# Patient Record
Sex: Female | Born: 1995 | Race: White | Hispanic: No | Marital: Married | Smoking: Never smoker
Health system: Southern US, Community
[De-identification: ages and names within clinical notes are randomized; demographics above are authoritative.]

## PROBLEM LIST (undated history)

## (undated) DIAGNOSIS — N941 Unspecified dyspareunia: Secondary | ICD-10-CM

## (undated) DIAGNOSIS — E559 Vitamin D deficiency, unspecified: Secondary | ICD-10-CM

## (undated) DIAGNOSIS — F419 Anxiety disorder, unspecified: Secondary | ICD-10-CM

## (undated) DIAGNOSIS — K219 Gastro-esophageal reflux disease without esophagitis: Secondary | ICD-10-CM

## (undated) DIAGNOSIS — E039 Hypothyroidism, unspecified: Secondary | ICD-10-CM

## (undated) DIAGNOSIS — I10 Essential (primary) hypertension: Secondary | ICD-10-CM

## (undated) DIAGNOSIS — G5601 Carpal tunnel syndrome, right upper limb: Secondary | ICD-10-CM

## (undated) DIAGNOSIS — J309 Allergic rhinitis, unspecified: Secondary | ICD-10-CM

## (undated) DIAGNOSIS — G43711 Chronic migraine without aura, intractable, with status migrainosus: Secondary | ICD-10-CM

## (undated) HISTORY — DX: Carpal tunnel syndrome, right upper limb: G56.01

## (undated) HISTORY — DX: Gastro-esophageal reflux disease without esophagitis: K21.9

## (undated) HISTORY — PX: TONSILLECTOMY AND ADENOIDECTOMY: SHX28

## (undated) HISTORY — DX: Vitamin D deficiency, unspecified: E55.9

## (undated) HISTORY — PX: WISDOM TOOTH EXTRACTION: SHX21

## (undated) HISTORY — DX: Hypothyroidism, unspecified: E03.9

## (undated) HISTORY — DX: Essential (primary) hypertension: I10

## (undated) HISTORY — DX: Allergic rhinitis, unspecified: J30.9

## (undated) HISTORY — DX: Chronic migraine without aura, intractable, with status migrainosus: G43.711

## (undated) HISTORY — PX: CHOLECYSTECTOMY: SHX55

## (undated) HISTORY — PX: HERNIA REPAIR: SHX51

## (undated) HISTORY — DX: Anxiety disorder, unspecified: F41.9

## (undated) HISTORY — DX: Unspecified dyspareunia: N94.10

---

## 2020-02-18 HISTORY — PX: LAPAROSCOPY: SHX197

## 2021-04-17 ENCOUNTER — Other Ambulatory Visit: Payer: Self-pay | Admitting: *Deleted

## 2021-04-17 ENCOUNTER — Encounter: Payer: Self-pay | Admitting: *Deleted

## 2021-04-18 ENCOUNTER — Ambulatory Visit (INDEPENDENT_AMBULATORY_CARE_PROVIDER_SITE_OTHER): Payer: 59 | Admitting: Psychiatry

## 2021-04-18 ENCOUNTER — Encounter: Payer: Self-pay | Admitting: Psychiatry

## 2021-04-18 VITALS — BP 138/94 | HR 87 | Ht 64.0 in | Wt 159.0 lb

## 2021-04-18 DIAGNOSIS — G43009 Migraine without aura, not intractable, without status migrainosus: Secondary | ICD-10-CM

## 2021-04-18 MED ORDER — PROPRANOLOL HCL 20 MG PO TABS: 20.0000 mg | ORAL_TABLET | Freq: Two times a day (BID) | ORAL | 3 refills | Status: DC

## 2021-04-18 MED ORDER — RIZATRIPTAN BENZOATE 5 MG PO TBDP
5.0000 mg | ORAL_TABLET | ORAL | 3 refills | Status: DC | PRN
Start: 2021-04-18 — End: 2021-08-12

## 2021-04-18 NOTE — Patient Instructions (Signed)
Start Maxalt as needed for migraines. Take at the onset of migraine. If headache recurs or does not fully resolve, you may take a second dose after 2 hours. Please avoid taking more than 2 days per week ? ?Start propranolol 20 mg twice a day for headache prevention ? ?

## 2021-04-18 NOTE — Progress Notes (Signed)
? ?Referring:  ?Dema Severin, NP ?[address] ?[place],  [place] [address] ? ?PCP: ?Dema Severin, NP ? ?Neurology was asked to evaluate Jacqueline Valenzuela, a 26 year old female for a chief complaint of headaches.  Our recommendations of care will be communicated by shared medical record.   ? ?CC:  headaches ? ?HPI:  ?Medical co-morbidities: anxiety, HTN, Hashimoto's ? ?The patient presents for evaluation of headaches which began when she was a teenager. They have worsened as she has gotten older. She has 2 headaches per week, with 1-2 debilitating headaches per month. Migraines are described as bitemporal pressure with associated photophobia, phonophobia, and nausea. They can last 3-4 days at a time. ? ?She tried Topamax and Trokendi, but could not tolerate either due to paresthesias. Tried Nurtec for rescue which was ineffective. Currently she is not taking any medication for her headaches. ? 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 ? ?Headache History: ?Onset: teenager ?Triggers: none ?Aura: no ?Location: temples ?Quality/Description: "like head is going to explode" ?Associated Symptoms: ? Photophobia: yes ? Phonophobia: yes ? Nausea: yes ?Vomiting: no ?Worse with activity?: ?Duration of headaches: 3-4 days ? ?Headache days per month: 10 ?Headache free days per month: 20 ? ?Current Treatment: ?Abortive ?None ? ?Preventative ?none ? ?Prior Therapies                                 ?Topamax - paresthesias ?Trokendi - paresthesias ?Lisinopril 20 mg daily ?Zoloft 100 mg daily ?Gabapentin 300 mg TID ?Excedrin ?Tylenol ?Ibuprofen ?Nurtec - lack of efficacy ? ?LABS: ?TSH 11/26/20 1.42 ? ?IMAGING:  ?MRI brain 03/14/21: unremarkable other than right basal ganglia DVA, unchanged from 2013 ? ?Current Outpatient Medications on File Prior to Visit  ?Medication Sig Dispense Refill  ? ALPRAZolam (XANAX) 0.5 MG tablet Take by mouth.    ? baclofen (LIORESAL) 10 MG  tablet Take 1 tablet by mouth 1-3 times per day including every night at least.    ? fluticasone (FLONASE) 50 MCG/ACT nasal spray TAKE 1 SPRAY INTO EACH NOSTRIL DAILY FOR 30 DAYS    ? gabapentin (NEURONTIN) 300 MG capsule Take 300 mg by mouth 3 (three) times daily.    ? hydrOXYzine (ATARAX) 25 MG tablet Take by mouth at bedtime.    ? levothyroxine (SYNTHROID) 100 MCG tablet Take 1 tablet by mouth daily.    ? lisinopril (ZESTRIL) 10 MG tablet Take 20 mg by mouth daily.    ? norethindrone (MICRONOR) 0.35 MG tablet Take 1 tablet by mouth daily.    ? omeprazole (PRILOSEC) 40 MG capsule TAKE 1 CAPSULE BY MOUTH ONCE DAILY IN THE EVENING FOR 90 DAYS    ? ?No current facility-administered medications on file prior to visit.  ? ? ? ?Allergies: ?Allergies  ?Allergen Reactions  ? Promethazine Other (See Comments)  ?  hallucinate ?  ? Latex Itching, Rash and Swelling  ?  unknown ?  ? ? ?Family History: ?Migraine or other headaches in the family:  mom ?Aneurysms in a first degree relative:  no ?Brain tumors in the family:  no ?Other neurological illness in the family:   no ? ?Past Medical History: ?Past Medical History:  ?Diagnosis Date  ? Allergic rhinitis   ? Anxiety disorder   ? Carpal tunnel syndrome on right   ? Chronic migraine without aura,  intractable, with status migrainosus   ? Dyspareunia in female   ? Gastroesophageal reflux disease   ? Hypertension, essential   ? Hypothyroidism, adult   ? Vitamin D deficiency, unspecified   ? ? ?Past Surgical History ?Past Surgical History:  ?Procedure Laterality Date  ? CHOLECYSTECTOMY    ? HERNIA REPAIR    ? LAPAROSCOPY  2022  ? endometriosis  ? TONSILLECTOMY AND ADENOIDECTOMY    ? WISDOM TOOTH EXTRACTION    ? ? ?Social History: ?Social History  ? ?Tobacco Use  ? Smoking status: Never  ? Smokeless tobacco: Never  ?Substance Use Topics  ? Alcohol use: Never  ? Drug use: Never  ? ? ?ROS: ?Negative for fevers, chills. Positive for headaches. All other systems reviewed and negative  unless stated otherwise in HPI. ? ? ?Physical Exam:  ? ?Vital Signs: ?BP (!) 138/94   Pulse 87   Ht 5\' 4"  (1.626 m)   Wt 159 lb (72.1 kg)   BMI 27.29 kg/m?  ?GENERAL: well appearing,in no acute distress,alert ?SKIN:  Color, texture, turgor normal. No rashes or lesions ?HEAD:  Normocephalic/atraumatic. ?CV:  RRR ?RESP: Normal respiratory effort ?MSK: +tenderness to palpation over bilateral temples ? ?NEUROLOGICAL: ?Mental Status: Alert, oriented to person, place and time,Follows commands ?Cranial Nerves: PERRL, visual fields intact to confrontation, extraocular movements intact, facial sensation intact, no facial droop or ptosis, hearing grossly intact, no dysarthria ?Motor: muscle strength 5/5 both upper and lower extremities,no drift, normal tone ?Reflexes: 2+ throughout ?Sensation: intact to light touch all 4 extremities ?Coordination: Finger-to- nose-finger intact bilaterally ?Gait: normal-based ? ? ?IMPRESSION: ?26 year old female with a history of anxiety, HTN, Hashimoto's who presents for evaluation of migraines. She was unable to tolerate Topamax/Trokendi due to paresthesias. Will start propranolol for migraine prevention. Maxalt started for rescue. ? ?PLAN: ?-Prevention: Start propranolol 20 mg BID ?-Rescue: Start Maxalt 5 mg PRN ?-next steps: consider SNRI, CGRP, Botox ? ?I spent a total of 24 minutes chart reviewing and counseling the patient. Headache education was done. Discussed treatment options including preventive and acute medications. Discussed medication overuse headache and to limit use of acute treatments to no more than 2 days/week or 10 days/month. Discussed medication side effects, adverse reactions and drug interactions. Written educational materials and patient instructions outlining all of the above were given. ? ?Follow-up: 3-4 months ? ? ?34, MD ?04/18/2021   ?3:23 PM ? ? ?

## 2021-04-24 ENCOUNTER — Encounter: Payer: Self-pay | Admitting: Psychiatry

## 2021-04-29 ENCOUNTER — Encounter: Payer: Self-pay | Admitting: Psychiatry

## 2021-05-07 ENCOUNTER — Telehealth: Payer: Self-pay | Admitting: *Deleted

## 2021-05-07 ENCOUNTER — Other Ambulatory Visit: Payer: Self-pay | Admitting: Psychiatry

## 2021-05-07 MED ORDER — EMGALITY 120 MG/ML ~~LOC~~ SOAJ: 1.0000 "pen " | SUBCUTANEOUS | 3 refills | Status: DC

## 2021-05-07 MED ORDER — EMGALITY 120 MG/ML ~~LOC~~ SOAJ: 2.0000 "pen " | Freq: Once | SUBCUTANEOUS | 0 refills | Status: AC

## 2021-05-07 NOTE — Telephone Encounter (Signed)
Emgality PA, key [id number]. Your PA has been faxed to the plan as a paper copy. Please contact the plan directly if you haven't received a determination in a typical timeframe. ?You will be notified of the determination electronically and via fax. ?

## 2021-05-08 NOTE — Telephone Encounter (Signed)
Received call from pharmacist asking for clarification of emgality Rx sent yesterday. I informed her one is for loading dose and other is maintenance dose. I advised pA is in process. She will hold maintenance Rx and fill loading Rx when PA is finalized. She verbalized understanding, appreciation. ? ?

## 2021-05-08 NOTE — Telephone Encounter (Addendum)
Received fax from insurance asking for member's full name, ID and DOB. The patient is insured under spouse's name. I messaged her asking for his DOB in order to [employer] PA. Patient replied: [employer] is under her name. I faxed copy of her card to [employer].  ?

## 2021-05-13 NOTE — Telephone Encounter (Addendum)
Received fax from [employer] re: unable to process emgality PA. She has alternative pharmacy benefits. Pharmacy must bill primary insurance plan first. If med is not covered or was denied an explanation of benefits or proof of denial is required prior to coverage with [employer]. Called pharmacy, spoke with Inetta Fermo who stated patient's primary insurance is [facility], under husband's name. When she runs Rx it states other meds must be tried first. She advised a PA needs to be done with [facility] phone #[phone]. Called #, reached [facility] help desk, spoke with Elvera Lennox, answered clinical questions. Routed to pharmacy for clinical review, reply via fax, phone in 72 hours. PA- [id number].  ?Sent patient my chart to update her. ?

## 2021-05-13 NOTE — Telephone Encounter (Signed)
Called [employer] to check status of emgality PA, spoke with Olegario Messier who stated they don't have emgality PA on file for patient. Did not get it from [facility]. She will fax form. Fax back as urgent, 2nd attempt, and they will try to make decision in 24 hours. Fax received,  placed on MD desk for review, signature. ?

## 2021-05-13 NOTE — Telephone Encounter (Signed)
PA form signed, faxed to [employer]. Received confirmation. ?

## 2021-05-14 NOTE — Telephone Encounter (Signed)
She has failed Topamax (paresthesias) and a beta blocker (propranolol - itching, paresthesias), so she should qualify for Emgality. We should be able to appeal

## 2021-05-14 NOTE — Telephone Encounter (Signed)
[facility] denied Emgality: must have 2 month try/fail or cannot take two of following: ?amitriptyline, beta blocker, cadesartan, divalproex, botox, topiramate, venlafaxine.   ?She has only failed Topamax.  ?Sent to MD. ?

## 2021-05-14 NOTE — Telephone Encounter (Signed)
Patient has failed topamax and propranolol, will appeal emgality denial. Letter written and on MD desk for review , signature.  ?

## 2021-05-15 NOTE — Telephone Encounter (Signed)
Emgality appeal letter signed faxed to [phone], [facility] Appeals. Received confirmation. ?

## 2021-05-20 NOTE — Telephone Encounter (Signed)
Called [facility] pharmacy help desk, spoke with Marcelino Duster who stated no status of appeal as of yet, appeals dept  # [phone].  ?

## 2021-05-23 ENCOUNTER — Encounter: Payer: Self-pay | Admitting: *Deleted

## 2021-05-23 NOTE — Telephone Encounter (Signed)
Called [employer], spoke with Jacqueline Valenzuela who stated PA overturned, emgality is approved until 11/20/2021. ?Sent my chart to patient. ?

## 2021-06-10 ENCOUNTER — Other Ambulatory Visit: Payer: Self-pay

## 2021-06-10 ENCOUNTER — Emergency Department (HOSPITAL_COMMUNITY): Payer: 59

## 2021-06-10 ENCOUNTER — Emergency Department (HOSPITAL_COMMUNITY)
Admission: EM | Admit: 2021-06-10 | Discharge: 2021-06-11 | Discharge status: Against Medical Advice | Payer: 59 | Attending: Emergency Medicine | Admitting: Emergency Medicine

## 2021-06-10 ENCOUNTER — Encounter (HOSPITAL_COMMUNITY): Payer: Self-pay

## 2021-06-10 DIAGNOSIS — Z793 Long term (current) use of hormonal contraceptives: Secondary | ICD-10-CM | POA: Diagnosis not present

## 2021-06-10 DIAGNOSIS — Z5321 Procedure and treatment not carried out due to patient leaving prior to being seen by health care provider: Secondary | ICD-10-CM | POA: Diagnosis not present

## 2021-06-10 DIAGNOSIS — R0789 Other chest pain: Secondary | ICD-10-CM | POA: Diagnosis present

## 2021-06-10 DIAGNOSIS — R000 Tachycardia, unspecified: Secondary | ICD-10-CM | POA: Insufficient documentation

## 2021-06-10 LAB — CBC WITH DIFFERENTIAL/PLATELET
Abs Immature Granulocytes: 0.02 10*3/uL (ref 0.00–0.07)
Basophils Absolute: 0 10*3/uL (ref 0.0–0.1)
Basophils Relative: 0 %
Eosinophils Absolute: 0 10*3/uL (ref 0.0–0.5)
Eosinophils Relative: 1 %
HCT: 39.8 % (ref 36.0–46.0)
Hemoglobin: 13 g/dL (ref 12.0–15.0)
Immature Granulocytes: 0 %
Lymphocytes Relative: 32 %
Lymphs Abs: 1.8 10*3/uL (ref 0.7–4.0)
MCH: 28.5 pg (ref 26.0–34.0)
MCHC: 32.7 g/dL (ref 30.0–36.0)
MCV: 87.3 fL (ref 80.0–100.0)
Monocytes Absolute: 0.5 10*3/uL (ref 0.1–1.0)
Monocytes Relative: 8 %
Neutro Abs: 3.4 10*3/uL (ref 1.7–7.7)
Neutrophils Relative %: 59 %
Platelets: 266 10*3/uL (ref 150–400)
RBC: 4.56 MIL/uL (ref 3.87–5.11)
RDW: 12.1 % (ref 11.5–15.5)
WBC: 5.8 10*3/uL (ref 4.0–10.5)
nRBC: 0 % (ref 0.0–0.2)

## 2021-06-10 LAB — BASIC METABOLIC PANEL
Anion gap: 6 (ref 5–15)
BUN: 10 mg/dL (ref 6–20)
CO2: 27 mmol/L (ref 22–32)
Calcium: 9.9 mg/dL (ref 8.9–10.3)
Chloride: 105 mmol/L (ref 98–111)
Creatinine, Ser: 0.73 mg/dL (ref 0.44–1.00)
GFR, Estimated: >60 mL/min (ref >60)
Glucose, Bld: 95 mg/dL (ref 70–99)
Potassium: 3.7 mmol/L (ref 3.5–5.1)
Sodium: 138 mmol/L (ref 135–145)

## 2021-06-10 LAB — TROPONIN I (HIGH SENSITIVITY): Troponin I (High Sensitivity): <2 ng/L (ref <18)

## 2021-06-10 LAB — I-STAT BETA HCG BLOOD, ED (MC, WL, AP ONLY): I-stat hCG, quantitative: <5 m[IU]/mL (ref <5)

## 2021-06-10 LAB — D-DIMER, QUANTITATIVE: D-Dimer, Quant: <0.27 ug/mL-FEU (ref 0.00–0.50)

## 2021-06-10 NOTE — ED Notes (Signed)
Pt states they are leaving d/t wait time ?

## 2021-06-10 NOTE — ED Triage Notes (Signed)
Pt began having 7/10 central to left sharp chest pain while cooking. Radiates to upper back. At this time chest pain is dull and still in upper back. No cardiac history. Denies sob ?

## 2021-06-10 NOTE — ED Provider Triage Note (Signed)
Emergency Medicine Provider Triage Evaluation Note ? ?Jacqueline Valenzuela , a 26 y.o. female  was evaluated in triage.  Pt complains of chest pain.  Chest pain started at 5 PM while patient was cooking.  Pain has been constant since then.  Pain is located to left side of her chest and radiates to her left arm.  Pain has waxed and waned in intensity.  Patient describes pain as a discomfort to "sharp pain."   ? ?Patient is on OCP birth control. ? ?Patient denies any nausea, vomiting, diaphoresis, palpitations, lightheadedness, syncope, hemoptysis, leg swelling or tenderness, history of DVT or PE. ? ?Review of Systems  ?Positive: Chest pain ?Negative: See above ? ?Physical Exam  ?BP (!) 129/98   Pulse (!) 101   Temp 98.7 ?F (37.1 ?C) (Oral)   Resp 16   SpO2 100%  ?Gen:   Awake, no distress   ?Resp:  Normal effort, clear to auscultation bilaterally ?MSK:   Moves extremities without difficulty; no swelling or tenderness of bilateral lower extremities ?Other:  +2 radial pulse bilaterally. ? ?Medical Decision Making  ?Medically screening exam initiated at 7:29 PM.  Appropriate orders placed.  Keiva Mcdougald was informed that the remainder of the evaluation will be completed by another provider, this initial triage assessment does not replace that evaluation, and the importance of remaining in the ED until their evaluation is complete. ? ?ACS work-up initiated.  Additionally will add on D-dimer due to patient's OCP use and heart rate of 101 ?  ?Haskel Schroeder, PA-C ?06/10/21 1931 ? ?

## 2021-06-11 LAB — TROPONIN I (HIGH SENSITIVITY): Troponin I (High Sensitivity): <2 ng/L (ref <18)

## 2021-08-12 ENCOUNTER — Encounter: Payer: Self-pay | Admitting: Psychiatry

## 2021-08-12 ENCOUNTER — Ambulatory Visit (INDEPENDENT_AMBULATORY_CARE_PROVIDER_SITE_OTHER): Payer: 59 | Admitting: Psychiatry

## 2021-08-12 VITALS — BP 123/88 | HR 118 | Ht 64.0 in | Wt 153.1 lb

## 2021-08-12 DIAGNOSIS — G43019 Migraine without aura, intractable, without status migrainosus: Secondary | ICD-10-CM

## 2021-08-12 MED ORDER — RIZATRIPTAN BENZOATE 10 MG PO TABS: 10.0000 mg | ORAL_TABLET | ORAL | 6 refills | Status: DC | PRN

## 2021-08-12 MED ORDER — EMGALITY 120 MG/ML ~~LOC~~ SOAJ: 1.0000 "pen " | SUBCUTANEOUS | 6 refills | Status: DC

## 2021-08-12 NOTE — Progress Notes (Signed)
   CC:  headaches  Follow-up Visit  Last visit: 04/18/21  Brief HPI: 26 year old female with a history of anxiety, HTN, Hashimoto's disease who follows in clinic for migraines.  At her last visit she was started on propranolol for migraine prevention and Maxalt for rescue.  Interval History: Propranolol caused side effects (face tingling) so she stopped it. She was switched to [facility] in March. This did improve her migraines and she has had periods where she would go a couple of weeks without a migraine. It does burn when she injects it but she otherwise has no side effects. Maxalt 5 mg PRN helps but headache will typically return a few hours later. She has not taken a second dose because she only gets 4 pills per month and is afraid she will run out. It does not cause side effects but she does not like the taste of the dissolvable tablets.   Headache days per month: 8 Headache free days per month: 22   Current Headache Regimen: Preventative: Emgality 120 mg monthly Abortive: Maxalt 5 mg PRN  Prior Therapies                                  Topamax - paresthesias Trokendi - paresthesias Lisinopril 20 mg daily Propranolol 20 mg BID Zoloft 100 mg daily Gabapentin 300 mg TID Emgality 120 mg monthly Excedrin Tylenol Ibuprofen Nurtec - lack of efficacy Maxalt 5 mg PRN  Physical Exam:   Vital Signs: BP 123/88   Pulse (!) 118   Ht 5\' 4"  (1.626 m)   Wt 153 lb 1 oz (69.4 kg)   BMI 26.27 kg/m  GENERAL:  well appearing, in no acute distress, alert  SKIN:  Color, texture, turgor normal. No rashes or lesions HEAD:  Normocephalic/atraumatic. RESP: normal respiratory effort MSK:  No gross joint deformities.   NEUROLOGICAL: Mental Status: Alert, oriented to person, place and time, Follows commands, and Speech fluent and appropriate. Cranial Nerves: PERRL, face symmetric, no dysarthria, hearing grossly intact Motor: moves all extremities equally Gait:  normal-based.  IMPRESSION: 26 year old female with a history of anxiety, HTN, Hashimoto's disease who presents for follow up of migraines. She has had improvement with Emgality but continues to have breakthrough migraines each month. Maxalt helps but does not fully relieve her headaches and she only gets 4 pills per month. Will increase Maxalt to 10 mg PRN which may be more effective.   PLAN: -Prevention: Continue Emgality 120 mg monthly -Rescue: Increase Maxalt to 10 mg PRN -Next steps: consider Qulipta, Cymbalta for prevention   Follow-up: 6 months  I spent a total of 20 minutes on the date of the service. Headache education was done. Discussed treatment options including preventive and acute medications. Discussed medication side effects, adverse reactions and drug interactions. Written educational materials and patient instructions outlining all of the above were given.  Ocie Doyne, MD 08/12/21 2:41 PM

## 2021-09-10 ENCOUNTER — Encounter: Payer: Self-pay | Admitting: Psychiatry

## 2021-09-11 ENCOUNTER — Other Ambulatory Visit: Payer: Self-pay | Admitting: Psychiatry

## 2021-09-11 MED ORDER — METHYLPREDNISOLONE 4 MG PO TBPK: ORAL_TABLET | ORAL | 0 refills | Status: DC

## 2021-12-13 ENCOUNTER — Encounter: Payer: Self-pay | Admitting: *Deleted

## 2021-12-13 ENCOUNTER — Telehealth: Payer: Self-pay | Admitting: *Deleted

## 2021-12-13 NOTE — Telephone Encounter (Signed)
EMGALITY INJ 120MG /ML, use as directed (1 per month), is approved for 12 months through 10/27/ 2024 .

## 2021-12-13 NOTE — Telephone Encounter (Signed)
Emgality PA< Key: [id number]. Your information has been sent to [facility].

## 2021-12-26 ENCOUNTER — Encounter (INDEPENDENT_AMBULATORY_CARE_PROVIDER_SITE_OTHER): Payer: 59 | Admitting: Psychiatry

## 2021-12-26 DIAGNOSIS — G43009 Migraine without aura, not intractable, without status migrainosus: Secondary | ICD-10-CM

## 2021-12-27 MED ORDER — ONDANSETRON HCL 8 MG PO TABS: 8.0000 mg | ORAL_TABLET | Freq: Three times a day (TID) | ORAL | 6 refills | Status: AC | PRN

## 2021-12-27 MED ORDER — SUMATRIPTAN SUCCINATE 50 MG PO TABS: 50.0000 mg | ORAL_TABLET | ORAL | 6 refills | Status: DC | PRN

## 2021-12-27 NOTE — Telephone Encounter (Signed)
Please see the MyChart message reply(ies) for my assessment and plan.    This patient gave consent for this Medical Advice Message and is aware that it may result in a bill to [employer], as well as the possibility of receiving a bill for a co-payment or deductible. They are an established patient, but are not seeking medical advice exclusively about a problem treated during an in person or video visit in the last seven days. I did not recommend an in person or video visit within seven days of my reply.    I spent a total of 5 minutes cumulative time within 7 days through [employer].  Ocie Doyne, MD  12/27/21 8:44 AM

## 2022-02-27 ENCOUNTER — Ambulatory Visit: Payer: 59 | Admitting: Psychiatry

## 2022-05-07 ENCOUNTER — Other Ambulatory Visit: Payer: Self-pay | Admitting: Psychiatry

## 2022-05-08 NOTE — Telephone Encounter (Signed)
Last seen on 08/13/22 Follow up scheduled on 09/29/22

## 2022-07-15 ENCOUNTER — Encounter: Payer: Self-pay | Admitting: Psychiatry

## 2022-07-15 ENCOUNTER — Other Ambulatory Visit: Payer: Self-pay | Admitting: Neurology

## 2022-07-15 MED ORDER — METHYLPREDNISOLONE 4 MG PO TBPK: ORAL_TABLET | ORAL | 0 refills | Status: AC

## 2022-07-15 NOTE — Telephone Encounter (Signed)
We can give the patient a steroid taper. Please ask her if she is willing to start it. It's a short course, 6 days.

## 2022-07-15 NOTE — Telephone Encounter (Signed)
Medrol dose pack sent to the pharmacy

## 2022-07-15 NOTE — Telephone Encounter (Signed)
Dr.Camara you are work in pm provider. Pt reports she has been taking her Maxalt to 10 mg PRN and Emgality monthly.  Please advise

## 2022-09-15 ENCOUNTER — Other Ambulatory Visit: Payer: Self-pay | Admitting: Psychiatry

## 2022-09-15 NOTE — Telephone Encounter (Signed)
Last seen on 08/12/21 Follow up scheduled on 09/29/22

## 2022-09-29 ENCOUNTER — Ambulatory Visit: Payer: 59 | Admitting: Psychiatry

## 2022-10-18 ENCOUNTER — Other Ambulatory Visit: Payer: Self-pay | Admitting: Psychiatry

## 2022-10-21 NOTE — Telephone Encounter (Signed)
Last seen on 03/14/21 No follow up scheduled   Pt needs OV scheduled

## 2022-10-22 ENCOUNTER — Other Ambulatory Visit: Payer: Self-pay | Admitting: Psychiatry

## 2022-10-27 ENCOUNTER — Other Ambulatory Visit: Payer: Self-pay | Admitting: Psychiatry

## 2022-10-28 NOTE — Telephone Encounter (Signed)
Last seen on 08/12/21 No 6 month follow up scheduled

## 2022-10-31 ENCOUNTER — Other Ambulatory Visit: Payer: Self-pay | Admitting: Psychiatry

## 2022-11-03 ENCOUNTER — Other Ambulatory Visit: Payer: Self-pay | Admitting: Psychiatry

## 2022-11-04 MED ORDER — EMGALITY 120 MG/ML ~~LOC~~ SOAJ: 120.0000 mg | SUBCUTANEOUS | 0 refills | Status: DC

## 2022-11-04 NOTE — Telephone Encounter (Signed)
Duplicate Rx was approved today on 11/04/22 already.

## 2022-11-04 NOTE — Telephone Encounter (Signed)
Last seen on 08/12/21 No follow up scheduled

## 2022-12-03 ENCOUNTER — Telehealth: Payer: Self-pay

## 2022-12-03 NOTE — Telephone Encounter (Signed)
*  [facility]  Pharmacy Patient Advocate Encounter   Received notification from CoverMyMeds that prior authorization for Emgality 120MG /ML auto-injectors (migraine)  is required/requested.   Insurance verification completed.   The patient is insured through [facility] .   Per test claim: PA required; PA submitted to [facility] via CoverMyMeds Key/confirmation #/EOC [id number] Status is pending

## 2022-12-08 ENCOUNTER — Other Ambulatory Visit: Payer: Self-pay

## 2022-12-08 ENCOUNTER — Encounter: Payer: Self-pay | Admitting: Adult Health

## 2022-12-08 MED ORDER — SUMATRIPTAN SUCCINATE 50 MG PO TABS: 50.0000 mg | ORAL_TABLET | ORAL | 6 refills | Status: AC | PRN

## 2022-12-09 ENCOUNTER — Other Ambulatory Visit: Payer: Self-pay

## 2022-12-09 MED ORDER — EMGALITY 120 MG/ML ~~LOC~~ SOAJ: 120.0000 mg | SUBCUTANEOUS | 0 refills | Status: DC

## 2022-12-12 ENCOUNTER — Other Ambulatory Visit (HOSPITAL_COMMUNITY): Payer: Self-pay

## 2022-12-12 NOTE — Telephone Encounter (Signed)
Pharmacy Patient Advocate Encounter  Received notification from [facility] that Prior Authorization for Emgality has been APPROVED from 12/03/2022 to 12/03/2023. Ran test claim, Copay is $refill too soon. This test claim was processed through [facility]- copay amounts may vary at other pharmacies due to pharmacy/plan contracts, or as the patient moves through the different stages of their insurance plan.

## 2023-01-03 ENCOUNTER — Other Ambulatory Visit: Payer: Self-pay | Admitting: Adult Health

## 2023-01-31 ENCOUNTER — Other Ambulatory Visit: Payer: Self-pay | Admitting: Adult Health

## 2023-02-02 ENCOUNTER — Other Ambulatory Visit: Payer: Self-pay | Admitting: Adult Health

## 2023-02-03 ENCOUNTER — Encounter: Payer: Self-pay | Admitting: Adult Health

## 2023-02-04 MED ORDER — EMGALITY 120 MG/ML ~~LOC~~ SOAJ: 120.0000 mg | SUBCUTANEOUS | 4 refills | Status: AC

## 2023-05-11 ENCOUNTER — Telehealth: Payer: Self-pay | Admitting: Psychiatry

## 2023-05-11 NOTE — Telephone Encounter (Signed)
 Pt reschedule appointment due to have to take kids to school.

## 2023-05-18 ENCOUNTER — Ambulatory Visit: Payer: 59 | Admitting: Adult Health

## 2023-07-19 ENCOUNTER — Other Ambulatory Visit: Payer: Self-pay | Admitting: Neurology

## 2023-11-30 ENCOUNTER — Telehealth: Payer: Self-pay

## 2023-11-30 NOTE — Telephone Encounter (Signed)
 Pharmacy Patient Advocate Encounter   Received notification from CoverMyMeds that prior authorization for Emgality  is due for renewal.   Insurance verification completed.   The patient is insured through N/A.  Action: Patient hasn't been seen in your office in over a year. Plan requires updated chart notes for PA renewal.

## 2023-11-30 NOTE — Telephone Encounter (Signed)
Patient cancelled appointment due to scheduling conflict.

## 2023-12-01 ENCOUNTER — Ambulatory Visit: Admitting: Adult Health

## 2024-01-13 ENCOUNTER — Ambulatory Visit: Admitting: Allergy and Immunology

## 2024-03-02 ENCOUNTER — Ambulatory Visit: Admitting: Allergy and Immunology

## 2024-03-13 IMAGING — DX DG CHEST 2V
2 series · 2 of 2 positions shown · non-contrast
Comparison: Chest x-ray 02/08/2017.

CLINICAL DATA: Chest pain.

EXAM:
CHEST - 2 VIEW

[chest pa]
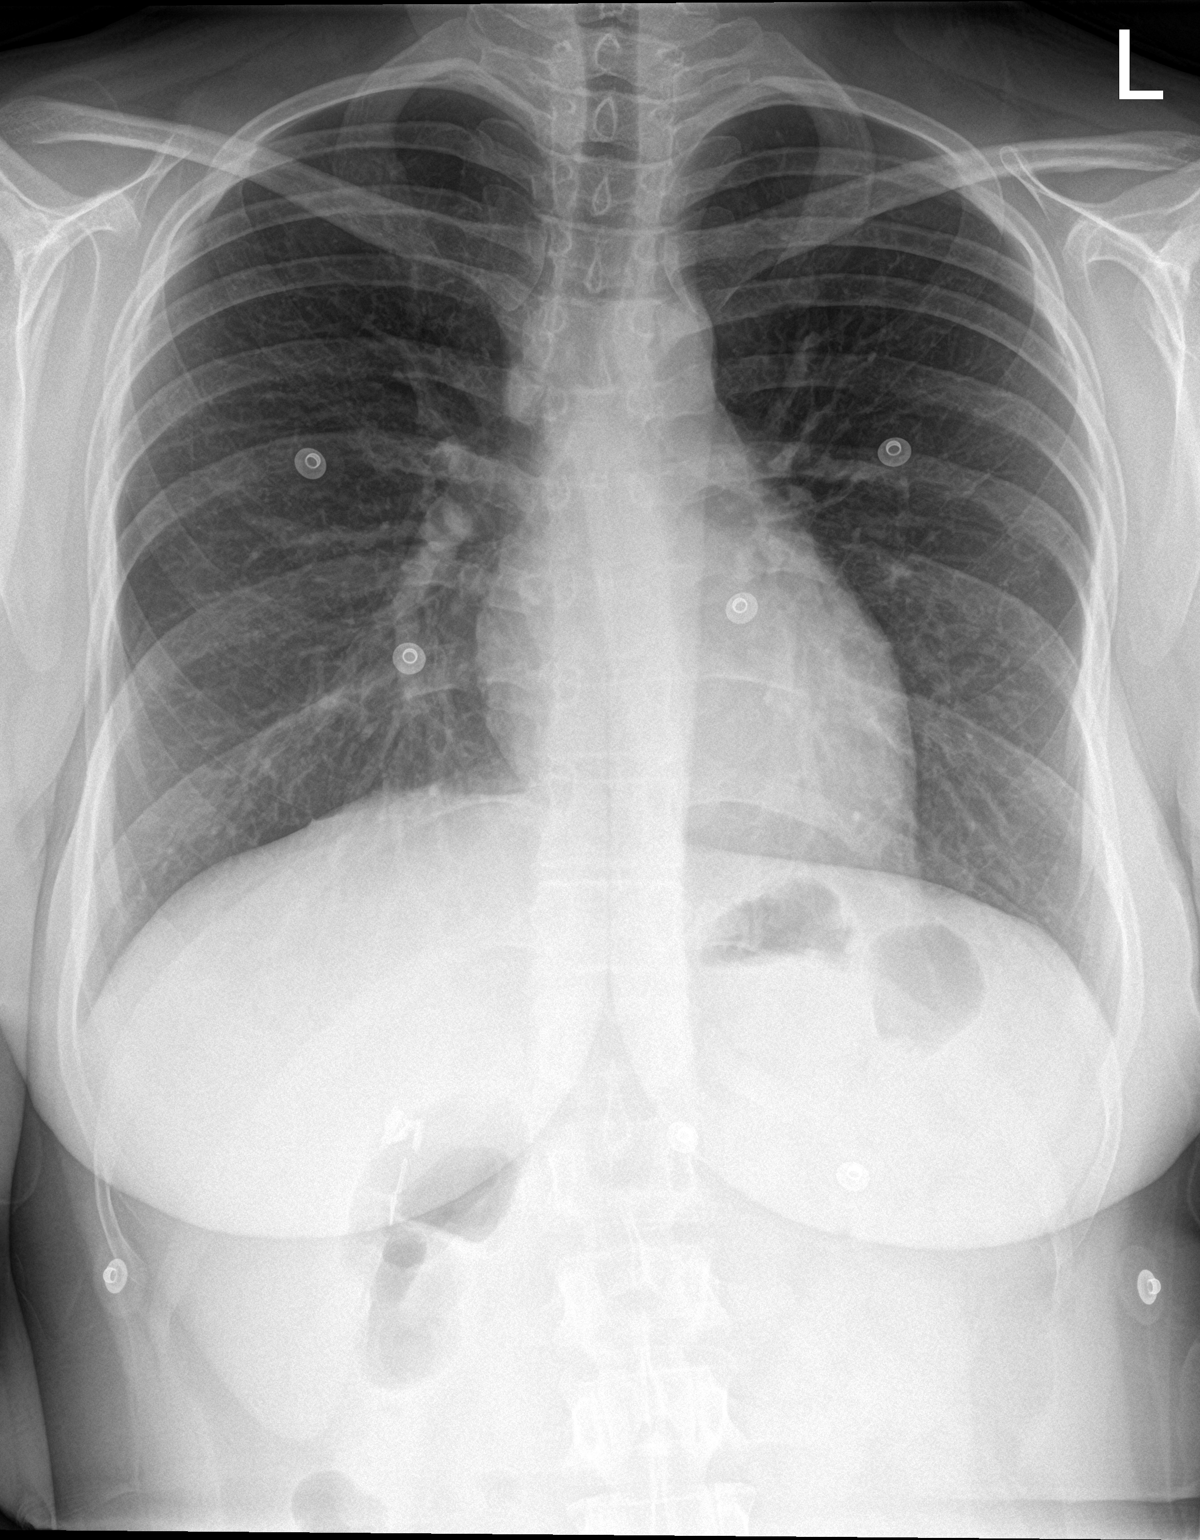

[chest lat]
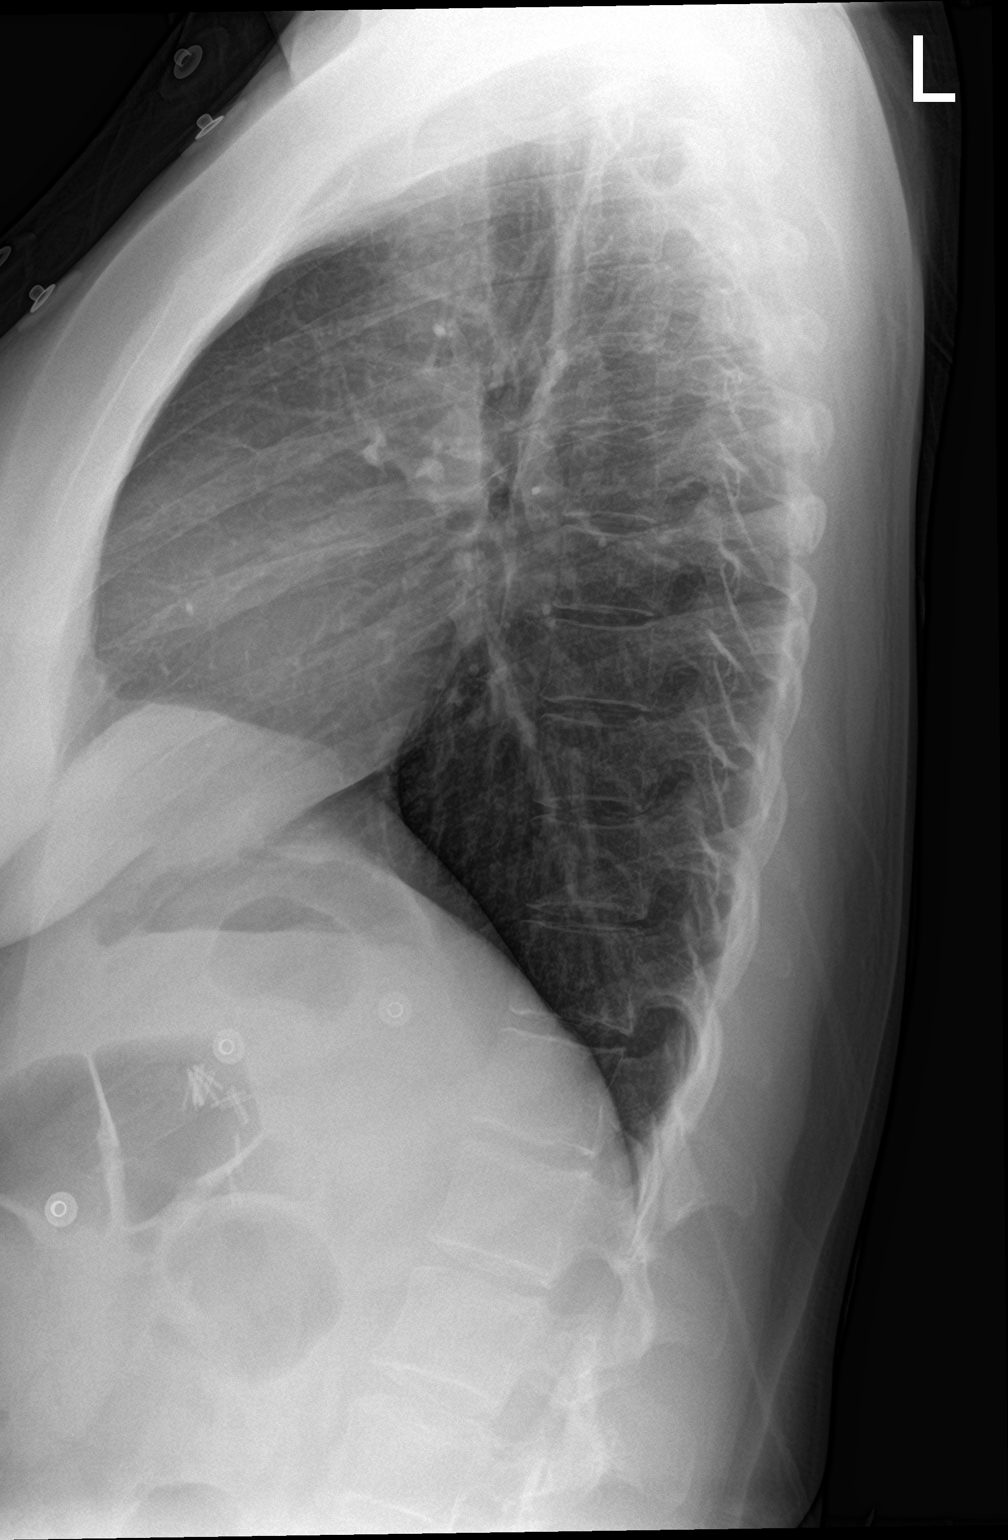

[2 of 2 positions shown; findings below may reference images not displayed]

FINDINGS: The heart size and mediastinal contours are within normal limits.
Both lungs are clear. The visualized skeletal structures are
unremarkable. There are surgical clips in the right upper quadrant.
IMPRESSION: No active cardiopulmonary disease.
